# Patient Record
Sex: Male | Born: 1975 | Hispanic: Yes | Marital: Married | Smoking: Never smoker
Health system: Southern US, Community
[De-identification: ages and names within clinical notes are randomized; demographics above are authoritative.]

---

## 2015-03-14 ENCOUNTER — Ambulatory Visit: Payer: Self-pay | Attending: Family Medicine

## 2021-06-24 ENCOUNTER — Emergency Department (HOSPITAL_COMMUNITY): Payer: Self-pay

## 2021-06-24 ENCOUNTER — Emergency Department (HOSPITAL_COMMUNITY)
Admission: EM | Admit: 2021-06-24 | Discharge: 2021-06-25 | Disposition: A | Discharge status: Home / Self Care | Payer: Self-pay | Attending: Emergency Medicine | Admitting: Emergency Medicine

## 2021-06-24 ENCOUNTER — Other Ambulatory Visit: Payer: Self-pay

## 2021-06-24 ENCOUNTER — Encounter (HOSPITAL_COMMUNITY): Payer: Self-pay

## 2021-06-24 DIAGNOSIS — R1012 Left upper quadrant pain: Secondary | ICD-10-CM | POA: Insufficient documentation

## 2021-06-24 DIAGNOSIS — S0990XA Unspecified injury of head, initial encounter: Secondary | ICD-10-CM | POA: Diagnosis not present

## 2021-06-24 DIAGNOSIS — Y92410 Unspecified street and highway as the place of occurrence of the external cause: Secondary | ICD-10-CM | POA: Diagnosis not present

## 2021-06-24 DIAGNOSIS — R109 Unspecified abdominal pain: Secondary | ICD-10-CM | POA: Diagnosis present

## 2021-06-24 DIAGNOSIS — Z20822 Contact with and (suspected) exposure to covid-19: Secondary | ICD-10-CM | POA: Diagnosis not present

## 2021-06-24 LAB — COMPREHENSIVE METABOLIC PANEL
ALT: 35 U/L (ref 0–44)
AST: 39 U/L (ref 15–41)
Albumin: 4.1 g/dL (ref 3.5–5.0)
Alkaline Phosphatase: 84 U/L (ref 38–126)
Anion gap: 9 (ref 5–15)
BUN: 15 mg/dL (ref 6–20)
CO2: 22 mmol/L (ref 22–32)
Calcium: 8.7 mg/dL — ABNORMAL LOW (ref 8.9–10.3)
Chloride: 106 mmol/L (ref 98–111)
Creatinine, Ser: 0.91 mg/dL (ref 0.61–1.24)
GFR, Estimated: >60 mL/min (ref >60)
Glucose, Bld: 109 mg/dL — ABNORMAL HIGH (ref 70–99)
Potassium: 3.7 mmol/L (ref 3.5–5.1)
Sodium: 137 mmol/L (ref 135–145)
Total Bilirubin: 0.7 mg/dL (ref 0.3–1.2)
Total Protein: 7.9 g/dL (ref 6.5–8.1)

## 2021-06-24 LAB — RESP PANEL BY RT-PCR (FLU A&B, COVID) ARPGX2
Influenza A by PCR: NEGATIVE
Influenza B by PCR: NEGATIVE
SARS Coronavirus 2 by RT PCR: NEGATIVE

## 2021-06-24 LAB — CBC
HCT: 44.7 % (ref 39.0–52.0)
Hemoglobin: 14.9 g/dL (ref 13.0–17.0)
MCH: 30.7 pg (ref 26.0–34.0)
MCHC: 33.3 g/dL (ref 30.0–36.0)
MCV: 92.2 fL (ref 80.0–100.0)
Platelets: 297 10*3/uL (ref 150–400)
RBC: 4.85 MIL/uL (ref 4.22–5.81)
RDW: 11.8 % (ref 11.5–15.5)
WBC: 8.5 10*3/uL (ref 4.0–10.5)
nRBC: 0 % (ref 0.0–0.2)

## 2021-06-24 LAB — URINALYSIS, ROUTINE W REFLEX MICROSCOPIC
Bilirubin Urine: NEGATIVE
Glucose, UA: NEGATIVE mg/dL
Hgb urine dipstick: NEGATIVE
Ketones, ur: NEGATIVE mg/dL
Leukocytes,Ua: NEGATIVE
Nitrite: NEGATIVE
Protein, ur: NEGATIVE mg/dL
Specific Gravity, Urine: 1.013 (ref 1.005–1.030)
pH: 6 (ref 5.0–8.0)

## 2021-06-24 LAB — LACTIC ACID, PLASMA: Lactic Acid, Venous: 1.1 mmol/L (ref 0.5–1.9)

## 2021-06-24 LAB — PROTIME-INR
INR: 1 (ref 0.8–1.2)
Prothrombin Time: 13.1 seconds (ref 11.4–15.2)

## 2021-06-24 MED ORDER — FENTANYL CITRATE PF 50 MCG/ML IJ SOSY
50.0000 ug | PREFILLED_SYRINGE | INTRAMUSCULAR | Status: DC | PRN
  Administered 2021-06-24: 50 ug via INTRAVENOUS
  Filled 2021-06-24: qty 1

## 2021-06-24 MED ORDER — IOHEXOL 300 MG/ML  SOLN
100.0000 mL | Freq: Once | INTRAMUSCULAR | Status: AC | PRN
  Administered 2021-06-24: 100 mL via INTRAVENOUS

## 2021-06-24 MED ORDER — LACTATED RINGERS IV BOLUS
1000.0000 mL | Freq: Once | INTRAVENOUS | Status: AC
  Administered 2021-06-24: 1000 mL via INTRAVENOUS
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
## 2021-06-25 ENCOUNTER — Emergency Department (HOSPITAL_COMMUNITY): Payer: Self-pay

## 2021-06-25 MED ORDER — IBUPROFEN 800 MG PO TABS: 800.0000 mg | ORAL_TABLET | Freq: Four times a day (QID) | ORAL | 0 refills | Status: AC | PRN

## 2021-06-25 MED ORDER — METHOCARBAMOL 500 MG PO TABS: 500.0000 mg | ORAL_TABLET | Freq: Three times a day (TID) | ORAL | 0 refills | Status: AC | PRN

## 2021-06-25 NOTE — ED Notes (Signed)
Pt ambulatory to restroom

## 2021-06-25 NOTE — ED Notes (Signed)
[place] collar placed on pt - pt transported to MRI at this time  ?

## 2021-06-25 NOTE — ED Notes (Signed)
Patient verbalizes understanding of discharge instructions. Opportunity for questioning and answers were provided. Armband removed by staff, pt discharged from ED ambulatory.   
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
